# Patient Record
Sex: Male | Born: 1985 | Race: Black or African American | Hispanic: No | Marital: Single | Smoking: Current every day smoker
Health system: Southern US, Community
[De-identification: ages and names within clinical notes are randomized; demographics above are authoritative.]

---
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
## 2003-06-02 ENCOUNTER — Encounter: Admission: RE | Admit: 2003-06-02 | Discharge: 2003-06-02 | Payer: Self-pay | Admitting: Family Medicine

## 2004-04-14 ENCOUNTER — Emergency Department (HOSPITAL_COMMUNITY): Admission: EM | Admit: 2004-04-14 | Discharge: 2004-04-14 | Payer: Self-pay | Admitting: Family Medicine

## 2004-09-06 ENCOUNTER — Emergency Department (HOSPITAL_COMMUNITY): Admission: EM | Admit: 2004-09-06 | Discharge: 2004-09-06 | Payer: Self-pay | Admitting: Family Medicine

## 2004-10-30 ENCOUNTER — Emergency Department (HOSPITAL_COMMUNITY): Admission: EM | Admit: 2004-10-30 | Discharge: 2004-10-30 | Payer: Self-pay | Admitting: Family Medicine

## 2004-11-07 ENCOUNTER — Ambulatory Visit: Payer: Self-pay | Admitting: Family Medicine

## 2004-11-07 ENCOUNTER — Encounter: Admission: RE | Admit: 2004-11-07 | Discharge: 2004-11-07 | Payer: Self-pay | Admitting: Sports Medicine

## 2004-12-16 ENCOUNTER — Emergency Department (HOSPITAL_COMMUNITY): Admission: AD | Admit: 2004-12-16 | Discharge: 2004-12-16 | Payer: Self-pay | Admitting: Family Medicine

## 2006-03-06 DIAGNOSIS — F988 Other specified behavioral and emotional disorders with onset usually occurring in childhood and adolescence: Secondary | ICD-10-CM | POA: Insufficient documentation

## 2007-12-21 ENCOUNTER — Emergency Department (HOSPITAL_COMMUNITY): Admission: EM | Admit: 2007-12-21 | Discharge: 2007-12-21 | Payer: Self-pay | Admitting: Emergency Medicine

## 2010-04-11 ENCOUNTER — Emergency Department (HOSPITAL_COMMUNITY)
Admission: EM | Admit: 2010-04-11 | Discharge: 2010-04-11 | Disposition: A | Discharge status: Home / Self Care | Payer: Self-pay | Attending: Emergency Medicine | Admitting: Emergency Medicine

## 2010-04-11 ENCOUNTER — Emergency Department (HOSPITAL_COMMUNITY): Payer: Self-pay

## 2010-04-11 DIAGNOSIS — M79609 Pain in unspecified limb: Secondary | ICD-10-CM | POA: Insufficient documentation

## 2010-04-11 DIAGNOSIS — IMO0002 Reserved for concepts with insufficient information to code with codable children: Secondary | ICD-10-CM | POA: Insufficient documentation

## 2010-04-11 DIAGNOSIS — F17200 Nicotine dependence, unspecified, uncomplicated: Secondary | ICD-10-CM | POA: Insufficient documentation

## 2010-10-12 LAB — POCT URINALYSIS DIP (DEVICE)
Bilirubin Urine: NEGATIVE
Glucose, UA: NEGATIVE mg/dL
Hgb urine dipstick: NEGATIVE
Ketones, ur: NEGATIVE mg/dL
Nitrite: NEGATIVE
Protein, ur: NEGATIVE mg/dL
Specific Gravity, Urine: 1.015 (ref 1.005–1.030)
Urobilinogen, UA: 2 mg/dL — ABNORMAL HIGH (ref 0.0–1.0)
pH: 8 (ref 5.0–8.0)

## 2010-10-12 LAB — GC/CHLAMYDIA PROBE AMP, GENITAL
Chlamydia, DNA Probe: POSITIVE — AB
GC Probe Amp, Genital: NEGATIVE

## 2010-10-12 LAB — RPR: RPR Ser Ql: NONREACTIVE

## 2010-10-12 LAB — HIV ANTIBODY (ROUTINE TESTING W REFLEX): HIV: NONREACTIVE

## 2013-04-17 ENCOUNTER — Emergency Department: Payer: Self-pay | Admitting: Emergency Medicine

## 2013-05-11 ENCOUNTER — Emergency Department: Payer: Self-pay | Admitting: Internal Medicine

## 2013-06-26 ENCOUNTER — Emergency Department: Payer: Self-pay | Admitting: Emergency Medicine

## 2013-09-03 ENCOUNTER — Emergency Department: Payer: Self-pay | Admitting: Emergency Medicine

## 2014-11-03 ENCOUNTER — Encounter (HOSPITAL_COMMUNITY): Payer: Self-pay | Admitting: Emergency Medicine

## 2014-11-03 ENCOUNTER — Emergency Department (HOSPITAL_COMMUNITY)
Admission: EM | Admit: 2014-11-03 | Discharge: 2014-11-03 | Disposition: A | Discharge status: Home / Self Care | Payer: Self-pay | Attending: Emergency Medicine | Admitting: Emergency Medicine

## 2014-11-03 DIAGNOSIS — L03115 Cellulitis of right lower limb: Secondary | ICD-10-CM | POA: Insufficient documentation

## 2014-11-03 DIAGNOSIS — Z720 Tobacco use: Secondary | ICD-10-CM | POA: Insufficient documentation

## 2014-11-03 DIAGNOSIS — L02419 Cutaneous abscess of limb, unspecified: Secondary | ICD-10-CM

## 2014-11-03 DIAGNOSIS — L03119 Cellulitis of unspecified part of limb: Secondary | ICD-10-CM

## 2014-11-03 DIAGNOSIS — L02415 Cutaneous abscess of right lower limb: Secondary | ICD-10-CM | POA: Insufficient documentation

## 2014-11-03 MED ORDER — IBUPROFEN 600 MG PO TABS: 600.0000 mg | ORAL_TABLET | Freq: Four times a day (QID) | ORAL | Status: DC | PRN

## 2014-11-03 MED ORDER — LIDOCAINE-EPINEPHRINE 1 %-1:100000 IJ SOLN: 20.0000 mL | Freq: Once | INTRAMUSCULAR | Status: DC

## 2014-11-03 MED ORDER — CLINDAMYCIN HCL 300 MG PO CAPS: 300.0000 mg | ORAL_CAPSULE | Freq: Four times a day (QID) | ORAL | Status: DC

## 2014-11-03 MED ORDER — LIDOCAINE-EPINEPHRINE 1 %-1:100000 IJ SOLN
20.0000 mL | Freq: Once | INTRAMUSCULAR | Status: AC
  Administered 2014-11-03: 20 mL via INTRADERMAL
  Filled 2014-11-03: qty 1

## 2014-11-03 MED ORDER — HYDROCODONE-ACETAMINOPHEN 5-325 MG PO TABS: 2.0000 | ORAL_TABLET | ORAL | Status: DC | PRN

## 2014-11-03 MED ORDER — OXYCODONE-ACETAMINOPHEN 5-325 MG PO TABS: 1.0000 | ORAL_TABLET | ORAL | Status: DC | PRN

## 2014-11-03 MED ORDER — CLINDAMYCIN HCL 300 MG PO CAPS
300.0000 mg | ORAL_CAPSULE | Freq: Once | ORAL | Status: AC
  Administered 2014-11-03: 300 mg via ORAL
  Filled 2014-11-03: qty 1

## 2014-11-03 MED ORDER — OXYCODONE-ACETAMINOPHEN 5-325 MG PO TABS
1.0000 | ORAL_TABLET | Freq: Once | ORAL | Status: AC
  Administered 2014-11-03: 1 via ORAL
  Filled 2014-11-03: qty 1

## 2014-11-03 NOTE — ED Notes (Signed)
MD at bedside. 

## 2014-11-03 NOTE — ED Notes (Signed)
Pt. reports worsening skin abscess at right knee for several weeks with drainage .

## 2014-11-03 NOTE — ED Notes (Signed)
ID bedside

## 2014-11-03 NOTE — Discharge Instructions (Signed)

## 2014-11-03 NOTE — ED Provider Notes (Signed)
CSN: [id number]     Arrival date & time 11/03/14  0206 History  By signing my name below, I, Tanda Rockers, attest that this documentation has been prepared under the direction and in the presence of Loren Racer, MD. Electronically Signed: Tanda Rockers, ED Scribe. 11/03/2014. 3:06 AM.  Chief Complaint  Patient presents with  . Abscess   The history is provided by the patient. No language interpreter was used.     HPI Comments: Russell West is a 29 y.o. male who presents to the Emergency Department complaining of gradual onset, constant, two abscess to right knee for several weeks. Pt states he had an open wound on his knee that he was being treated for with Keflex prior to the abscesses forming. He notes drainage, swelling, and 8/10 pain to the area as well. Denies fever, chills, or any other associated symptoms.   History reviewed. No pertinent past medical history. History reviewed. No pertinent past surgical history. No family history on file. Social History  Substance Use Topics  . Smoking status: Current Every Day Smoker  . Smokeless tobacco: None  . Alcohol Use: Yes    Review of Systems  Constitutional: Negative for fever and chills.  Gastrointestinal: Negative for nausea and vomiting.  Musculoskeletal: Positive for joint swelling. Negative for arthralgias.  Skin: Positive for color change.       + Two abscesses to right knee with swelling and drainage  Neurological: Negative for dizziness, weakness and numbness.  All other systems reviewed and are negative.  Allergies  Review of patient's allergies indicates no known allergies.  Home Medications   Prior to Admission medications   Medication Sig Start Date End Date Taking? Authorizing Provider  clindamycin (CLEOCIN) 300 MG capsule Take 1 capsule (300 mg total) by mouth 4 (four) times daily. X 7 days 11/03/14   Loren Racer, MD  HYDROcodone-acetaminophen [facility]) 5-325 MG tablet Take 2 tablets by mouth every 4  (four) hours as needed. 11/03/14   Loren Racer, MD  ibuprofen (ADVIL,MOTRIN) 600 MG tablet Take 1 tablet (600 mg total) by mouth every 6 (six) hours as needed. 11/03/14   Loren Racer, MD   Triage VItals: BP 146/69 mmHg  Pulse 75  Temp(Src) 98.3 F (36.8 C) (Oral)  Resp 20  SpO2 100%   Physical Exam  Constitutional: He is oriented to person, place, and time. He appears well-developed and well-nourished. No distress.  HENT:  Head: Normocephalic and atraumatic.  Eyes: EOM are normal. Pupils are equal, round, and reactive to light.  Neck: Normal range of motion. Neck supple.  Cardiovascular: Normal rate and regular rhythm.   Pulmonary/Chest: Effort normal and breath sounds normal.  Abdominal: Soft.  Musculoskeletal: Normal range of motion. He exhibits no edema or tenderness.  Patient with normal range of motion of the right knee. There is some mild superficial swelling surrounding a 2 abscesses just proximal to the right knee. One is roughly 2 cm and a high diameter and the other 1.5 cm. There is some mild surrounding erythema.  Neurological: He is alert and oriented to person, place, and time.  Skin: Skin is warm and dry. No rash noted. No erythema.  Psychiatric: He has a normal mood and affect. His behavior is normal.  Nursing note and vitals reviewed.   ED Course  .Marland KitchenIncision and Drainage Date/Time: 11/03/2014 4:42 AM Performed by: Loren Racer Authorized by: Ranae Palms, Jelina Paulsen Consent: Verbal consent obtained. Patient identity confirmed: verbally with patient Type: abscess Body area: lower extremity Location details:  right leg Anesthesia: local infiltration Local anesthetic: lidocaine 1% with epinephrine Anesthetic total: 4 ml Scalpel size: 11 Incision type: single straight Incision depth: dermal Complexity: complex Drainage: purulent Drainage amount: moderate Wound treatment: wound left open Packing material: none Patient tolerance: Patient tolerated the  procedure well with no immediate complications   (including critical care time)  DIAGNOSTIC STUDIES: Oxygen Saturation is 100% on RA, normal by my interpretation.    COORDINATION OF CARE: 3:04 AM-Discussed treatment plan which includes pain medication and I&D with pt at bedside and pt agreed to plan.   Labs Review Labs Reviewed - No data to display  Imaging Review No results found.   EKG Interpretation None      MDM   Final diagnoses:  Cellulitis and abscess of leg    I personally performed the services described in this documentation, which was scribed in my presence. The recorded information has been reviewed and is accurate.    Given concern for possible overlying cellulitis will start on clindamycin. Patient's been given return precautions and is voiced understanding.    Loren Raceravid Lurdes Haltiwanger, MD 11/03/14 [phone]

## 2015-01-30 ENCOUNTER — Encounter: Payer: Self-pay | Admitting: Emergency Medicine

## 2015-01-30 ENCOUNTER — Emergency Department
Admission: EM | Admit: 2015-01-30 | Discharge: 2015-01-30 | Disposition: A | Discharge status: Home / Self Care | Payer: Self-pay | Attending: Emergency Medicine | Admitting: Emergency Medicine

## 2015-01-30 DIAGNOSIS — L03111 Cellulitis of right axilla: Secondary | ICD-10-CM

## 2015-01-30 DIAGNOSIS — L03112 Cellulitis of left axilla: Secondary | ICD-10-CM

## 2015-01-30 DIAGNOSIS — F17210 Nicotine dependence, cigarettes, uncomplicated: Secondary | ICD-10-CM | POA: Insufficient documentation

## 2015-01-30 MED ORDER — TRAMADOL HCL 50 MG PO TABS: 50.0000 mg | ORAL_TABLET | Freq: Four times a day (QID) | ORAL | Status: DC | PRN

## 2015-01-30 MED ORDER — SULFAMETHOXAZOLE-TRIMETHOPRIM 800-160 MG PO TABS: 1.0000 | ORAL_TABLET | Freq: Two times a day (BID) | ORAL | Status: DC

## 2015-01-30 NOTE — Discharge Instructions (Signed)

## 2015-01-30 NOTE — ED Provider Notes (Signed)
[facility] Emergency Department Provider Note  ____________________________________________  Time seen: Approximately 10:17 PM  I have reviewed the triage vital signs and the nursing notes.   HISTORY  Chief Complaint Abscess    HPI Russell West is a 30 y.o. male who presents emergency department for bumps and bilateral axilla. He states that he is switched to a new deodorant and since then he has noticed some increased swelling and pain to the "knots" in his armpit. He denies any fevers or chills. He denies any drainage. He states that area is very painful to the touch. She has not tried any medications prior to arrival. He has had other abscesses in the past with them the past 6 months.   History reviewed. No pertinent past medical history.  Patient Active Problem List   Diagnosis Date Noted  . 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 No pertinent past surgical history.  Current Outpatient Rx  Name  Route  Sig  Dispense  Refill  . clindamycin (CLEOCIN) 300 MG capsule   Oral   Take 1 capsule (300 mg total) by mouth 4 (four) times daily. X 7 days   28 capsule   0   . ibuprofen (ADVIL,MOTRIN) 600 MG tablet   Oral   Take 1 tablet (600 mg total) by mouth every 6 (six) hours as needed.   30 tablet   0   . oxyCODONE-acetaminophen (PERCOCET) 5-325 MG tablet   Oral   Take 1 tablet by mouth every 4 (four) hours as needed.   10 tablet   0   . sulfamethoxazole-trimethoprim (BACTRIM DS,SEPTRA DS) 800-160 MG tablet   Oral   Take 1 tablet by mouth 2 (two) times daily.   14 tablet   0   . traMADol (ULTRAM) 50 MG tablet   Oral   Take 1 tablet (50 mg total) by mouth every 6 (six) hours as needed.   20 tablet   0     Allergies Review of patient's allergies indicates no known allergies.  No family history on file.  Social History Social History  Substance Use Topics  . Smoking status: Current Every Day Smoker --  0.50 packs/day    Types: Cigarettes  . Smokeless tobacco: None  . Alcohol Use: Yes     Review of Systems  Constitutional: No fever/chills Eyes: No visual changes. No discharge ENT: No sore throat. Cardiovascular: no chest pain. Respiratory: no cough. No SOB. Gastrointestinal: No abdominal pain.  No nausea, no vomiting.  No diarrhea.  No constipation. Genitourinary: Negative for dysuria. No hematuria Musculoskeletal: Negative for back pain. Skin: Negative for rash. endorses swollen "knots" bilateral armpits  Neurological: Negative for headaches, focal weakness or numbness. 10-point ROS otherwise negative.  ____________________________________________   PHYSICAL EXAM:  VITAL SIGNS: ED Triage Vitals  Enc Vitals Group     BP 01/30/15 2028 135/62 mmHg     Pulse Rate 01/30/15 2028 80     Resp 01/30/15 2028 18     Temp 01/30/15 2028 98 F (36.7 C)     Temp src --      SpO2 01/30/15 2028 99 %     Weight 01/30/15 2028 200 lb (90.719 kg)     Height 01/30/15 2028  (1.778 m)     Head Cir --      Peak Flow --      Pain Score 01/30/15 2026 8     Pain Loc --  Pain Edu? --      Excl. in GC? --      Constitutional: Alert and oriented. Well appearing and in no acute distress. Eyes: Conjunctivae are normal. PERRL. EOMI. Head: Atraumatic. ENT:      Ears:       Nose: No congestion/rhinnorhea.      Mouth/Throat: Mucous membranes are moist.  Neck: No stridor.   Hematological/Lymphatic/Immunilogical: No cervical lymphadenopathy. Cardiovascular: Normal rate, regular rhythm. Normal S1 and S2.  Good peripheral circulation. Respiratory: Normal respiratory effort without tachypnea or retractions. Lungs CTAB. Gastrointestinal: Soft and nontender. No distention. No CVA tenderness. Musculoskeletal: No lower extremity tenderness nor edema.  No joint effusions. Neurologic:  Normal speech and language. No gross focal neurologic deficits are appreciated.  Skin:  Skin is warm, dry  and intact. No rash noted. 3 lesions noted to each axilla. Areas are edematous and erythematous. No drainage noted. No fluctuance. Area is very firm to palpation. Area is very tender to palpation.  Psychiatric: Mood and affect are normal. Speech and behavior are normal. Patient exhibits appropriate insight and judgement.   ____________________________________________   LABS (all labs ordered are listed, but only abnormal results are displayed)  Labs Reviewed - No data to display ____________________________________________  EKG   ____________________________________________  RADIOLOGY   No results found.  ____________________________________________    PROCEDURES  Procedure(s) performed:       Medications - No data to display   ____________________________________________   INITIAL IMPRESSION / ASSESSMENT AND PLAN / ED COURSE  Pertinent labs & imaging results that were available during my care of the patient were reviewed by me and considered in my medical decision making (see chart for details).  Patient's diagnosis is consistent with cellulitis to bilateral axilla. Patient will be discharged home with prescriptions for antibiotics. Patient is to follow up with primary care provider if symptoms persist past this treatment course. Patient is given ED precautions to return to the ED for any worsening or new symptoms.     ____________________________________________  FINAL CLINICAL IMPRESSION(S) / ED DIAGNOSES  Final diagnoses:  Cellulitis of axilla, left  Cellulitis of axilla, right      NEW MEDICATIONS STARTED DURING THIS VISIT:  New Prescriptions   SULFAMETHOXAZOLE-TRIMETHOPRIM (BACTRIM DS,SEPTRA DS) 800-160 MG TABLET    Take 1 tablet by mouth 2 (two) times daily.   TRAMADOL (ULTRAM) 50 MG TABLET    Take 1 tablet (50 mg total) by mouth every 6 (six) hours as needed.       Delorise Royals Ahmet Schank, PA-C 01/30/15 2220  Jeanmarie Plant, MD 01/30/15  2330

## 2015-01-30 NOTE — ED Notes (Signed)
Patient ambulatory to triage with steady gait, without difficulty or distress noted; pt reports abscess to axillae bilat since changing deodorant

## 2016-10-11 ENCOUNTER — Emergency Department (HOSPITAL_COMMUNITY): Payer: Self-pay

## 2016-10-11 ENCOUNTER — Emergency Department (HOSPITAL_COMMUNITY)
Admission: EM | Admit: 2016-10-11 | Discharge: 2016-10-11 | Disposition: A | Discharge status: Home / Self Care | Payer: Self-pay | Attending: Physician Assistant | Admitting: Physician Assistant

## 2016-10-11 ENCOUNTER — Encounter (HOSPITAL_COMMUNITY): Payer: Self-pay | Admitting: Emergency Medicine

## 2016-10-11 ENCOUNTER — Other Ambulatory Visit: Payer: Self-pay

## 2016-10-11 DIAGNOSIS — F17210 Nicotine dependence, cigarettes, uncomplicated: Secondary | ICD-10-CM | POA: Insufficient documentation

## 2016-10-11 DIAGNOSIS — R0789 Other chest pain: Secondary | ICD-10-CM | POA: Insufficient documentation

## 2016-10-11 DIAGNOSIS — F908 Attention-deficit hyperactivity disorder, other type: Secondary | ICD-10-CM | POA: Insufficient documentation

## 2016-10-11 NOTE — ED Triage Notes (Signed)
Pt c/o pain in right rib cage x's 2 weeks after hitting on the side of a couch.  Pt st's the rib pain causes his chest to hurt when he takes a deep breath or cough.  Pt also requesting a work note

## 2016-10-11 NOTE — Discharge Instructions (Signed)
Please read and follow all provided instructions.  Your diagnoses today include:  1. Chest wall pain     Tests performed today include: Vital signs. See below for your results today.   Medications prescribed:  Take as prescribed   Home care instructions:  Follow any educational materials contained in this packet.  Follow-up instructions: Please follow-up with your primary care provider for further evaluation of symptoms and treatment   Return instructions:  Please return to the Emergency Department if you do not get better, if you get worse, or new symptoms OR  - Fever (temperature greater than 101.21F)  - Bleeding that does not stop with holding pressure to the area    -Severe pain (please note that you may be more sore the day after your accident)  - Chest Pain  - Difficulty breathing  - Severe nausea or vomiting  - Inability to tolerate food and liquids  - Passing out  - Skin becoming red around your wounds  - Change in mental status (confusion or lethargy)  - New numbness or weakness    Please return if you have any other emergent concerns.  Additional Information:  Your vital signs today were: BP 139/72 (BP Location: Right Arm)    Pulse 62    Temp 98.1 F (36.7 C) (Oral)    Resp 16    Ht  (1.778 m)    Wt 93.4 kg (206 lb)    SpO2 99%    BMI 29.56 kg/m  If your blood pressure (BP) was elevated above 135/85 this visit, please have this repeated by your doctor within one month. ---------------

## 2016-10-11 NOTE — ED Provider Notes (Signed)
[facility]-EMERGENCY DEPT Provider Note   CSN: [id number] Arrival date & time: 10/11/16  1538     History   Chief Complaint Chief Complaint  Patient presents with  . Chest Pain    HPI Russell West is a 31 y.o. male.  HPI  31 y.o. male, presents to the Emergency Department today due to right rib cage pain x 2 weeks. This occurred after striking the side of a couch when tripping. Noted pain on onset. Notes rib pain causes pain with inspiration as well as coughing. Minimal pain without movement of chest wall. Pain with turning torso. Rates pain 3/10 currently with movement. Described as dull sensation. None at rest. No meds PTA. Has not tried OTC remedies other than Motrin once. No cough/congestion. No N/V. No diaphoresis. No numbness/tingling. No SOB/ABD pain. No other symptoms noted.    History reviewed. No pertinent past medical history.  Patient Active Problem List   Diagnosis Date Noted  . 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 No pertinent surgical history.     Home Medications    Prior to Admission medications   Medication Sig Start Date End Date Taking? Authorizing Provider  clindamycin (CLEOCIN) 300 MG capsule Take 1 capsule (300 mg total) by mouth 4 (four) times daily. X 7 days 11/03/14   Loren Racer, MD  ibuprofen (ADVIL,MOTRIN) 600 MG tablet Take 1 tablet (600 mg total) by mouth every 6 (six) hours as needed. 11/03/14   Loren Racer, MD  oxyCODONE-acetaminophen (PERCOCET) 5-325 MG tablet Take 1 tablet by mouth every 4 (four) hours as needed. 11/03/14   Loren Racer, MD  sulfamethoxazole-trimethoprim (BACTRIM DS,SEPTRA DS) 800-160 MG tablet Take 1 tablet by mouth 2 (two) times daily. 01/30/15   Cuthriell, Delorise Royals, PA-C  traMADol (ULTRAM) 50 MG tablet Take 1 tablet (50 mg total) by mouth every 6 (six) hours as needed. 01/30/15   Cuthriell, Delorise Royals, PA-C    Family History No family history on file.  Social  History Social History  Substance Use Topics  . Smoking status: Current Every Day Smoker    Packs/day: 0.50    Types: Cigarettes  . Smokeless tobacco: Never Used  . Alcohol use Yes     Allergies   Patient has no known allergies.   Review of Systems Review of Systems ROS reviewed and all are negative for acute change except as noted in the HPI.  Physical Exam Updated Vital Signs BP 139/72 (BP Location: Right Arm)   Pulse 62   Temp 98.1 F (36.7 C) (Oral)   Resp 16   Ht  (1.778 m)   Wt 93.4 kg (206 lb)   SpO2 99%   BMI 29.56 kg/m   Physical Exam  Constitutional: He is oriented to person, place, and time. He appears well-developed and well-nourished. No distress.  HENT:  Head: Normocephalic and atraumatic.  Right Ear: Tympanic membrane, external ear and ear canal normal.  Left Ear: Tympanic membrane, external ear and ear canal normal.  Nose: Nose normal.  Mouth/Throat: Uvula is midline, oropharynx is clear and moist and mucous membranes are normal. No trismus in the jaw. No oropharyngeal exudate, posterior oropharyngeal erythema or tonsillar abscesses.  Eyes: Pupils are equal, round, and reactive to light. EOM are normal.  Neck: Normal range of motion. Neck supple. No tracheal deviation present.  Cardiovascular: Normal rate, regular rhythm, S1 normal, S2 normal, normal heart sounds, intact distal pulses and normal pulses.   Pulmonary/Chest: Effort normal and breath sounds  normal. No respiratory distress. He has no decreased breath sounds. He has no wheezes. He has no rhonchi. He has no rales.  TTP right lateral chest wall. No palpable or visible deformities.   Abdominal: Normal appearance and bowel sounds are normal. There is no tenderness.  Musculoskeletal: Normal range of motion.  Neurological: He is alert and oriented to person, place, and time.  Skin: Skin is warm and dry.  Psychiatric: He has a normal mood and affect. His speech is normal and behavior is  normal. Thought content normal.     ED Treatments / Results  Labs (all labs ordered are listed, but only abnormal results are displayed) Labs Reviewed - No data to display  EKG  EKG Interpretation None       Radiology Dg Chest 2 View  Result Date: 10/11/2016 CLINICAL DATA:  Chest pain EXAM: CHEST  2 VIEW COMPARISON:  06/26/2013 chest radiograph. FINDINGS: Stable cardiomediastinal silhouette with normal heart size. No pneumothorax. No pleural effusion. Lungs appear clear, with no acute consolidative airspace disease and no pulmonary edema. No displaced fractures in the visualized chest. IMPRESSION: No active cardiopulmonary disease. Electronically Signed   By: Delbert Phenix M.D.   On: 10/11/2016 16:33    Procedures Procedures (including critical care time)  Medications Ordered in ED Medications - No data to display   Initial Impression / Assessment and Plan / ED Course  I have reviewed the triage vital signs and the nursing notes.  Pertinent labs & imaging results that were available during my care of the patient were reviewed by me and considered in my medical decision making (see chart for details).  Final Clinical Impressions(s) / ED Diagnoses   {I have reviewed and evaluated the relevant imaging studies.  {I have reviewed the relevant previous healthcare records.  {I obtained HPI from historian.   ED Course:  Assessment: presents to the Emergency Department today due to right rib cage pain x 2 weeks. This occurred after striking the side of a couch when tripping. Noted pain on onset. Notes rib pain causes pain with inspiration as well as coughing. Minimal pain without movement of chest wall. Pain with turning torso. Rates pain 3/10 currently with movement. Described as dull sensation. None at rest. No meds PTA. Has not tried OTC remedies other than Motrin once. No cough/congestion. No N/V. No diaphoresis. No numbness/tingling. No SOB/ABD pain. Patient X-Ray negative for  obvious fracture or dislocation.  Conservative therapy recommended and discussed. Doubt acute etiology (I.E. PE/ACS) given etiology of onset of pain. Likely musculoskeletal. Patient will be discharged home & is agreeable with above plan. Returns precautions discussed. Pt appears safe for discharge.  Disposition/Plan:  DC Home Additional Verbal discharge instructions given and discussed with patient.  Pt Instructed to f/u with PCP in the next week for evaluation and treatment of symptoms. Return precautions given Pt acknowledges and agrees with plan  Supervising Physician Mackuen, Courteney Lyn, *  Final diagnoses:  Chest wall pain    New Prescriptions New Prescriptions   No medications on file     Audry Pili, Cordelia Poche 10/11/16 1717    Corlis Leak, Cindee Salt, MD 10/12/16 0000

## 2020-07-01 ENCOUNTER — Emergency Department (HOSPITAL_COMMUNITY)
Admission: EM | Admit: 2020-07-01 | Discharge: 2020-07-01 | Disposition: A | Discharge status: Home / Self Care | Payer: Self-pay | Attending: Emergency Medicine | Admitting: Emergency Medicine

## 2020-07-01 ENCOUNTER — Encounter (HOSPITAL_COMMUNITY): Payer: Self-pay

## 2020-07-01 ENCOUNTER — Other Ambulatory Visit: Payer: Self-pay

## 2020-07-01 ENCOUNTER — Emergency Department (HOSPITAL_COMMUNITY): Payer: Self-pay

## 2020-07-01 DIAGNOSIS — M25562 Pain in left knee: Secondary | ICD-10-CM | POA: Insufficient documentation

## 2020-07-01 DIAGNOSIS — G8929 Other chronic pain: Secondary | ICD-10-CM | POA: Insufficient documentation

## 2020-07-01 DIAGNOSIS — F17210 Nicotine dependence, cigarettes, uncomplicated: Secondary | ICD-10-CM | POA: Insufficient documentation

## 2020-07-01 MED ORDER — MELOXICAM 7.5 MG PO TABS: 7.5000 mg | ORAL_TABLET | Freq: Every day | ORAL | 0 refills | Status: AC

## 2020-07-01 MED ORDER — LIDOCAINE 5 % EX PTCH: 1.0000 | MEDICATED_PATCH | CUTANEOUS | 0 refills | Status: AC

## 2020-07-01 NOTE — ED Provider Notes (Signed)
[facility] EMERGENCY DEPARTMENT Provider Note   CSN: [id number] Arrival date & time: 07/01/20  0301     History Chief Complaint  Patient presents with   Knee Pain    Russell West is a 35 y.o. male with no significant past medical history who presents for evaluation of knee pain.  Pain to left knee over the last 2 months.  He states he was sleeping, got up quickly and feels like he must of "twisted" his knee.  He has had pain to the medial aspect of his left knee since.  States he gets intermittent swelling.  Joint is not swollen today.  No overlying redness or warmth.  No tenderness to tib-fib, femur.  No unilateral leg swelling, redness, warmth.  No history of PE or DVT.  Pain only located at medial joint line.  Pain worse when he walks.  States he cannot do sudden or sharp movements as this causes sharp pain to his inner knee.  He is not followed by orthopedics.  Intermittently has taken Tylenol and ibuprofen at home with short-term relief of his pain.  He denies actual falls onto the knee.  Rates his pain a 6/10.  No hip or lower back pain. No radicular sx. No fever, chills, numbness, weakness of, redness, swelling, warmth. Ambulatory without difficulty. Denies additional aggravating or alleviating factors.  History obtained from patient and past medical records. No interpretor was used.  HPI     History reviewed. No pertinent past medical history.  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 No pertinent surgical history.     No family history on file.  Social History   Tobacco Use   Smoking status: Every Day    Packs/day: 0.50    Pack years: 0.00    Types: Cigarettes   Smokeless tobacco: Never  Substance Use Topics   Alcohol use: Yes   Drug use: No    Home Medications Prior to Admission medications   Medication Sig Start Date End Date Taking? Authorizing Provider   lidocaine (LIDODERM) 5 % Place 1 patch onto the skin daily. Remove & Discard patch within 12 hours or as directed by MD 07/01/20  Yes Rosmary Dionisio A, PA-C  meloxicam (MOBIC) 7.5 MG tablet Take 1 tablet (7.5 mg total) by mouth daily. 07/01/20  Yes Howell Groesbeck A, PA-C    Allergies    Patient has no known allergies.  Review of Systems   Review of Systems  Constitutional: Negative.   HENT: Negative.    Respiratory: Negative.    Cardiovascular: Negative.   Gastrointestinal: Negative.   Genitourinary: Negative.   Musculoskeletal:  Negative for gait problem.       Left knee pain  Neurological: Negative.   All other systems reviewed and are negative.  Physical Exam Updated Vital Signs BP 130/76 (BP Location: Right Arm)   Pulse 66   Temp 98.1 F (36.7 C) (Oral)   Resp 16   SpO2 95%   Physical Exam Vitals and nursing note reviewed.  Constitutional:      General: He is not in acute distress.    Appearance: He is well-developed. He is not ill-appearing, toxic-appearing or diaphoretic.  HENT:     Head: Normocephalic and atraumatic.     Nose: Nose normal.     Mouth/Throat:     Mouth: Mucous membranes are moist.  Eyes:     Pupils: Pupils are  equal, round, and reactive to light.  Cardiovascular:     Rate and Rhythm: Normal rate and regular rhythm.     Pulses: Normal pulses.     Heart sounds: Normal heart sounds.  Pulmonary:     Effort: Pulmonary effort is normal. No respiratory distress.     Breath sounds: Normal breath sounds.  Abdominal:     General: Bowel sounds are normal. There is no distension.     Palpations: Abdomen is soft.  Musculoskeletal:        General: Normal range of motion.     Cervical back: Normal range of motion and neck supple.     Comments: Tenderness to left medial joint line at knee.  No bony tenderness to tibial plateau, tib-fib, femur.  No obvious joint effusion.  Negative anterior drawer.  Nontender to popliteal fossa. Pain with valgus stress  to left knee.  Compartments soft.  Nontender calf.  Skin:    General: Skin is warm and dry.     Capillary Refill: Capillary refill takes less than 2 seconds.     Comments: No effusion.  No edema, erythema or warmth.  No rashes or lesions.  Neurological:     General: No focal deficit present.     Mental Status: He is alert and oriented to person, place, and time.     Motor: No weakness.     Gait: Gait normal.     Comments: Intact sensation.  Ambulatory without difficulty Intact strength    ED Results / Procedures / Treatments   Labs (all labs ordered are listed, but only abnormal results are displayed) Labs Reviewed - No data to display  EKG None  Radiology DG Knee Complete 4 Views Left  Result Date: 07/01/2020 CLINICAL DATA:  Left knee pain and swelling EXAM: LEFT KNEE - COMPLETE 4+ VIEW COMPARISON:  None. FINDINGS: No evidence of fracture, dislocation, or joint effusion. No evidence of arthropathy or other focal bone abnormality. Soft tissues are unremarkable. IMPRESSION: Negative. Electronically Signed   By: Helyn Numbers MD   On: 07/01/2020 03:34    Procedures .Ortho Injury Treatment  Date/Time: 07/01/2020 7:08 AM Performed by: Linwood Dibbles, PA-C Authorized by: Linwood Dibbles, PA-C   Consent:    Consent obtained:  Verbal   Consent given by:  Patient   Risks discussed:  Fracture, nerve damage, restricted joint movement, vascular damage, stiffness, recurrent dislocation and irreducible dislocation   Alternatives discussed:  No treatment, alternative treatment, immobilization, referral and delayed treatmentInjury location: knee Location details: left knee Injury type: soft tissue Pre-procedure neurovascular assessment: neurovascularly intact Pre-procedure distal perfusion: normal Pre-procedure neurological function: normal Pre-procedure range of motion: normal Immobilization: brace Splint Applied by: Milon Dikes Post-procedure neurovascular assessment:  post-procedure neurovascularly intact Post-procedure distal perfusion: normal Post-procedure neurological function: normal Post-procedure range of motion: normal     Medications Ordered in ED Medications - No data to display  ED Course  I have reviewed the triage vital signs and the nursing notes.  Pertinent labs & imaging results that were available during my care of the patient were reviewed by me and considered in my medical decision making (see chart for details).  Here for left knee pain x2 months.  Thinks he twisted it.  He is afebrile, nonseptic, not ill-appearing.  Tender to left medial joint line with some pain to valgus stress.  Negative anterior drawer.  No clinical evidence of DVT.  No overlying effusion, erythema, warmth.  Able to straight leg raise bilaterally.  Neurovascularly intact.  X-ray obtained here of left knee does not show any obvious effusion, fracture, dislocation.  Ambulatory here without difficulty  Placed in knee immobilizer, Rx for NSAIDs, Lidoderm patches and given FU with Ortho.  Low suspicion for occult fracture, dislocation, septic joint, hemarthrosis, VTE, ischemic limb, myositis.  The patient has been appropriately medically screened and/or stabilized in the ED. I have low suspicion for any other emergent medical condition which would require further screening, evaluation or treatment in the ED or require inpatient management.  Patient is hemodynamically stable and in no acute distress.  Patient able to ambulate in department prior to ED.  Evaluation does not show acute pathology that would require ongoing or additional emergent interventions while in the emergency department or further inpatient treatment.  I have discussed the diagnosis with the patient and answered all questions.  Pain is been managed while in the emergency department and patient has no further complaints prior to discharge.  Patient is comfortable with plan discussed in room and is  stable for discharge at this time.  I have discussed strict return precautions for returning to the emergency department.  Patient was encouraged to follow-up with PCP/specialist refer to at discharge.     MDM Rules/Calculators/A&P                           Final Clinical Impression(s) / ED Diagnoses Final diagnoses:  Chronic pain of left knee    Rx / DC Orders ED Discharge Orders          Ordered    meloxicam (MOBIC) 7.5 MG tablet  Daily        07/01/20 0654    lidocaine (LIDODERM) 5 %  Every 24 hours        07/01/20 0654             Taeden Geller A, PA-C 07/01/20 6045    Jacalyn Lefevre, MD 07/01/20 [phone]

## 2020-07-01 NOTE — Discharge Instructions (Signed)
Follow up with Orthopedics  Use the medications as prescribed  Return for new or worsening symptoms

## 2020-07-01 NOTE — ED Triage Notes (Signed)
Pt states that he has been having L knee pain for the past few weeks, denies injury.

## 2020-07-01 NOTE — Progress Notes (Signed)
Orthopedic Tech Progress Note Patient Details:  Russell West [birth year] [id number]  Ortho Devices Type of Ortho Device: Knee Sleeve Ortho Device/Splint Location: LLE Ortho Device/Splint Interventions: Application, Ordered   Post Interventions Patient Tolerated: Well Instructions Provided: Adjustment of device  Russell West A Aruna Nestler 07/01/2020, 7:13 AM

## 2020-10-01 ENCOUNTER — Ambulatory Visit (HOSPITAL_COMMUNITY)
Admission: EM | Admit: 2020-10-01 | Discharge: 2020-10-01 | Disposition: A | Discharge status: Home / Self Care | Payer: Self-pay

## 2020-10-01 ENCOUNTER — Emergency Department (HOSPITAL_COMMUNITY): Payer: Self-pay

## 2020-10-01 ENCOUNTER — Other Ambulatory Visit: Payer: Self-pay

## 2020-10-01 ENCOUNTER — Encounter (HOSPITAL_COMMUNITY): Payer: Self-pay | Admitting: Emergency Medicine

## 2020-10-01 ENCOUNTER — Encounter (HOSPITAL_COMMUNITY): Payer: Self-pay

## 2020-10-01 ENCOUNTER — Emergency Department (HOSPITAL_COMMUNITY)
Admission: EM | Admit: 2020-10-01 | Discharge: 2020-10-01 | Disposition: A | Discharge status: Home / Self Care | Payer: Self-pay | Attending: Emergency Medicine | Admitting: Emergency Medicine

## 2020-10-01 DIAGNOSIS — S3022XA Contusion of scrotum and testes, initial encounter: Secondary | ICD-10-CM | POA: Insufficient documentation

## 2020-10-01 DIAGNOSIS — Y9301 Activity, walking, marching and hiking: Secondary | ICD-10-CM | POA: Insufficient documentation

## 2020-10-01 DIAGNOSIS — W228XXA Striking against or struck by other objects, initial encounter: Secondary | ICD-10-CM | POA: Insufficient documentation

## 2020-10-01 DIAGNOSIS — F17210 Nicotine dependence, cigarettes, uncomplicated: Secondary | ICD-10-CM | POA: Insufficient documentation

## 2020-10-01 DIAGNOSIS — N50811 Right testicular pain: Secondary | ICD-10-CM

## 2020-10-01 DIAGNOSIS — N50819 Testicular pain, unspecified: Secondary | ICD-10-CM

## 2020-10-01 MED ORDER — OXYCODONE-ACETAMINOPHEN 5-325 MG PO TABS
1.0000 | ORAL_TABLET | Freq: Once | ORAL | Status: AC
  Administered 2020-10-01: 1 via ORAL
  Filled 2020-10-01: qty 1

## 2020-10-01 NOTE — ED Triage Notes (Signed)
Pt presents with groin pain. 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 Pt states he has been in pain since last night after chasing his dogs.

## 2020-10-01 NOTE — ED Notes (Signed)
Patient finding ride home.

## 2020-10-01 NOTE — ED Triage Notes (Signed)
Patient here from Urgent Care reporting right sided testicular pain that started last night.

## 2020-10-01 NOTE — Discharge Instructions (Signed)
You were seen in the emergency department for evaluation of testicular and scrotal pain after blunt injury.  You had an ultrasound that showed a hematoma but no evidence of torsion or testicular rupture.  Urology is recommending tight fitting underwear, ice, ibuprofen.  Follow-up with them in 1 week or so.  Return if any worsening or concerning symptoms

## 2020-10-01 NOTE — ED Provider Notes (Signed)
[facility]    CSN: [id number] Arrival date & time: 10/01/20  1547      History   Chief Complaint Chief Complaint  Patient presents with   Groin Pain    HPI Russell West is a 35 y.o. male presenting with groin pain for about 1 day.  States that he was chasing his dogs, and immediately after felt right testicular pain.  States he looked at it and it looks swollen.  Also with pain radiating to groin and back.  States he looked up symptoms of testicular torsion online and he is concerned that he has this.  Denies urinary symptoms or penile discharge.  Denies history of testicular torsion in the past.  HPI  History reviewed. No pertinent past medical history.  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 No pertinent surgical history.     Home Medications    Prior to Admission medications   Medication Sig Start Date End Date Taking? Authorizing Provider  lidocaine (LIDODERM) 5 % Place 1 patch onto the skin daily. Remove & Discard patch within 12 hours or as directed by MD 07/01/20   Henderly, Britni A, PA-C  meloxicam (MOBIC) 7.5 MG tablet Take 1 tablet (7.5 mg total) by mouth daily. 07/01/20   Henderly, Britni A, PA-C    Family History History reviewed. No pertinent family history.  Social History Social History   Tobacco Use   Smoking status: Every Day    Packs/day: 0.50    Types: Cigarettes   Smokeless tobacco: Never  Vaping Use   Vaping Use: Never used  Substance Use Topics   Alcohol use: Yes   Drug use: No     Allergies   Patient has no known allergies.   Review of Systems Review of Systems  Constitutional:  Negative for chills and fever.  HENT:  Negative for sore throat.   Eyes:  Negative for pain and redness.  Respiratory:  Negative for shortness of breath.   Cardiovascular:  Negative for chest pain.  Gastrointestinal:  Negative for abdominal pain,  diarrhea, nausea and vomiting.  Genitourinary:  Positive for testicular pain. Negative for decreased urine volume, difficulty urinating, dysuria, flank pain, frequency, genital sores, hematuria, penile discharge, penile pain, penile swelling, scrotal swelling and urgency.  Musculoskeletal:  Negative for back pain.  Skin:  Negative for rash.  All other systems reviewed and are negative.   Physical Exam Triage Vital Signs ED Triage Vitals  Enc Vitals Group     BP 10/01/20 1646 126/77     Pulse Rate 10/01/20 1645 86     Resp 10/01/20 1646 19     Temp 10/01/20 1646 98.1 F (36.7 C)     Temp Source 10/01/20 1646 Oral     SpO2 10/01/20 1645 97 %     Weight --      Height --      Head Circumference --      Peak Flow --      Pain Score 10/01/20 1643 6     Pain Loc --      Pain Edu? --      Excl. in GC? --    No data found.  Updated Vital Signs BP 126/77 (BP Location: Left Arm)   Pulse 86   Temp 98.1 F (36.7 C) (Oral)   Resp 19   SpO2 97%   Visual Acuity Right Eye Distance:   Left  Eye Distance:   Bilateral Distance:    Right Eye Near:   Left Eye Near:    Bilateral Near:     Physical Exam Vitals reviewed.  Constitutional:      General: He is not in acute distress.    Appearance: Normal appearance. He is not ill-appearing.  HENT:     Head: Normocephalic and atraumatic.     Mouth/Throat:     Mouth: Mucous membranes are moist.     Comments: Moist mucous membranes Eyes:     Extraocular Movements: Extraocular movements intact.     Pupils: Pupils are equal, round, and reactive to light.  Cardiovascular:     Rate and Rhythm: Normal rate and regular rhythm.     Heart sounds: Normal heart sounds.  Pulmonary:     Effort: Pulmonary effort is normal.     Breath sounds: Normal breath sounds. No wheezing, rhonchi or rales.  Abdominal:     General: Bowel sounds are normal. There is no distension.     Palpations: Abdomen is soft. There is no mass.     Tenderness: There is  no abdominal tenderness. There is no right CVA tenderness, left CVA tenderness, guarding or rebound.  Genitourinary:    Comments: Declines GU exam but did show me a photo of his right testicle which does appear enlarged. Patient ambulating with pain. Skin:    General: Skin is warm.     Capillary Refill: Capillary refill takes less than 2 seconds.     Comments: Good skin turgor  Neurological:     General: No focal deficit present.     Mental Status: He is alert and oriented to person, place, and time.  Psychiatric:        Mood and Affect: Mood normal.        Behavior: Behavior normal.     UC Treatments / Results  Labs (all labs ordered are listed, but only abnormal results are displayed) Labs Reviewed - No data to display  EKG   Radiology No results found.  Procedures Procedures (including critical care time)  Medications Ordered in UC Medications - No data to display  Initial Impression / Assessment and Plan / UC Course  I have reviewed the triage vital signs and the nursing notes.  Pertinent labs & imaging results that were available during my care of the patient were reviewed by me and considered in my medical decision making (see chart for details).     This patient is a very pleasant 35 y.o. year old male presenting with testicular pain and swelling following running.  This patient declined GU exam but did show me a photo which does indeed show an enlarged testicle.  Given concern, I recommended he head to the emergency department for ultrasound and further evaluation.  He is in agreement, states he will head straight there.   Final Clinical Impressions(s) / UC Diagnoses   Final diagnoses:  Pain in testicle, unspecified laterality     Discharge Instructions      -Please head to ED for further evaluation of possible testicular torsion. If you do have a testicular torsion this is a medical emergency and needs to be evaluated with ultrasound and possible surgery  immediately so you don't lose the testicle.   ED Prescriptions   None    PDMP not reviewed this encounter.   Rhys Martini, PA-C 10/01/20 1815

## 2020-10-01 NOTE — ED Provider Notes (Signed)
[facility]-EMERGENCY DEPT Provider Note   CSN: [id number] Arrival date & time: 10/01/20  1755     History Chief Complaint  Patient presents with   Testicle Pain    Russell West is a 35 y.o. male.  He is here with a complaint of right scrotal pain and swelling that started this morning.  He said he was walking the dogs and they saw a cat which pulled him forward and he struck his testicles on a pole.  Since then he has had symptoms.  He went to urgent care and they referred him here for an ultrasound.  He has had no difficulty urinating and he has not seen any blood in his urine.  No other injuries or complaints.  Right testicle hurts more than left and is swollen.  The history is provided by the patient.  Testicle Pain This is a new problem. The current episode started 6 to 12 hours ago. The problem occurs constantly. The problem has not changed since onset.Pertinent negatives include no chest pain, no abdominal pain, no headaches and no shortness of breath. The symptoms are aggravated by walking. Nothing relieves the symptoms. He has tried rest for the symptoms. The treatment provided no relief.      History reviewed. No pertinent past medical history.  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 No pertinent surgical history.     No family history on file.  Social History   Tobacco Use   Smoking status: Every Day    Packs/day: 0.50    Types: Cigarettes   Smokeless tobacco: Never  Vaping Use   Vaping Use: Never used  Substance Use Topics   Alcohol use: Yes   Drug use: No    Home Medications Prior to Admission medications   Medication Sig Start Date End Date Taking? Authorizing Provider  lidocaine (LIDODERM) 5 % Place 1 patch onto the skin daily. Remove & Discard patch within 12 hours or as directed by MD 07/01/20   Henderly, Britni A, PA-C  meloxicam (MOBIC) 7.5 MG  tablet Take 1 tablet (7.5 mg total) by mouth daily. 07/01/20   Henderly, Britni A, PA-C    Allergies    Patient has no known allergies.  Review of Systems   Review of Systems  Constitutional:  Negative for fever.  HENT:  Negative for sore throat.   Eyes:  Negative for visual disturbance.  Respiratory:  Negative for shortness of breath.   Cardiovascular:  Negative for chest pain.  Gastrointestinal:  Negative for abdominal pain.  Genitourinary:  Positive for scrotal swelling and testicular pain. Negative for dysuria.  Musculoskeletal:  Negative for back pain.  Skin:  Negative for wound.  Neurological:  Negative for headaches.   Physical Exam Updated Vital Signs BP 129/73 (BP Location: Left Arm)   Pulse 90   Temp 98.2 F (36.8 C) (Oral)   Resp 16   SpO2 98%   Physical Exam Vitals and nursing note reviewed.  Constitutional:      Appearance: Normal appearance. He is well-developed.  HENT:     Head: Normocephalic and atraumatic.  Eyes:     Conjunctiva/sclera: Conjunctivae normal.  Cardiovascular:     Rate and Rhythm: Normal rate and regular rhythm.     Pulses: Normal pulses.  Pulmonary:     Effort: Pulmonary effort is normal.  Abdominal:     Tenderness: There is no abdominal tenderness. There  is no guarding or rebound.  Genitourinary:    Penis: Normal.      Testes:        Right: Tenderness and swelling present.        Left: Tenderness or swelling not present.  Musculoskeletal:     Cervical back: Neck supple.  Skin:    General: Skin is warm and dry.  Neurological:     General: No focal deficit present.     Mental Status: He is alert.     GCS: GCS eye subscore is 4. GCS verbal subscore is 5. GCS motor subscore is 6.     Gait: Gait normal.    ED Results / Procedures / Treatments   Labs (all labs ordered are listed, but only abnormal results are displayed) Labs Reviewed - No data to display  EKG None  Radiology US Scrotum  Result Date: 10/01/2020 CLINICAL  DATA:  Injury swelling EXAM: ULTRASOUND OF SCROTUM TECHNIQUE: Complete ultrasound examination of the testicles, epididymis, and other scrotal structures was performed. COMPARISON:  None. FINDINGS: Right testicle Measurements: 4.2 x 2.6 x 3 cm. No mass or microlithiasis visualized. Thickened soft tissue posterior and inferior aspect of the right testis with flow. Heterogenous echotexture. Left testicle Measurements: 4.6 x 2.3 x 2.7 cm. No mass or microlithiasis visualized. Right epididymis:  Normal in size and appearance. Left epididymis:  Normal in size and appearance. Hydrocele:  None visualized. Varicocele:  None visualized. IMPRESSION: 1. Heterogeneous hypoechoic vascular tissue surrounding inferior pole of the testis and slightly posterior to it. This is asymmetric compared to the contralateral left testis. Suspect that the findings are related to soft tissue contusion and edema involving the scrotum and possibly the tail of the epididymis. Short interval sonographic follow-up recommended to ensure resolution. 2. Otherwise negative scrotal ultrasound Electronically Signed   By: Jasmine Pang M.D.   On: 10/01/2020 19:46    Procedures Procedures   Medications Ordered in ED Medications  oxyCODONE-acetaminophen (PERCOCET/ROXICET) 5-325 MG per tablet 1 tablet (has no administration in time range)    ED Course  I have reviewed the triage vital signs and the nursing notes.  Pertinent labs & imaging results that were available during my care of the patient were reviewed by me and considered in my medical decision making (see chart for details).  Clinical Course as of 10/02/20 1048  Sun Oct 01, 2020  2013 Reviewed results with patient regarding urology's recommendations.  He is comfortable plan for discharge. [MB]  2014 Discussed with Dr. Retta Diones from [facility] urology.  He reviewed the ultrasound and said the patient is appropriate for outpatient follow-up.  Tight briefs, ice, ibuprofen, follow-up in  a week or so in the office. [MB]    Clinical Course User Index [MB] Terrilee Files, MD   MDM Rules/Calculators/A&P                           This patient complains of right testicular pain and swelling after direct trauma; this involves an extensive number of treatment Options and is a complaint that carries with it a high risk of complications and Morbidity. The differential includes testicular rupture, hematoma, torsion  I ordered medication oral pain medication with improvement of symptoms  I ordered imaging studies which included scrotal ultrasound and I independently    visualized and interpreted imaging which showed hematoma, no evidence of torsion or testicular rupture Previous records obtained and reviewed in epic including urgent care visit I  consulted Dr. Retta Diones urology and discussed lab and imaging findings  Critical Interventions: None  After the interventions stated above, I reevaluated the patient and found patient symptoms to be improved.  Reviewed results of ultrasound and urology recommendations.  All questions answered.  Return instructions discussed  Final Clinical Impression(s) / ED Diagnoses Final diagnoses:  Right testicular pain  Traumatic hematoma of scrotum, initial encounter    Rx / DC Orders ED Discharge Orders     None        Terrilee Files, MD 10/02/20 1051

## 2020-10-01 NOTE — Discharge Instructions (Addendum)
-  Please head to ED for further evaluation of possible testicular torsion. If you do have a testicular torsion this is a medical emergency and needs to be evaluated with ultrasound and possible surgery immediately so you don't lose the testicle.

## 2020-10-01 NOTE — ED Provider Notes (Signed)
Emergency Medicine Provider Triage Evaluation Note  Russell West , a 35 y.o. male  was evaluated in triage.  Pt complains of testicular pain and swelling.  Patient states he was walking his dogs at about 6 AM this morning when they saw a cat and pulled him into a pole where he hit his testicle.  Since then he has had worsening right testicular pain and swelling, it is bothering him with all movement.  He presented to urgent care and they sent him here for evaluation.  Review of Systems  Positive: Testicular pain and swelling, right lower back pain Negative: Dysuria, diarrhea, abdominal pain, N/V  Physical Exam  BP 129/73 (BP Location: Left Arm)   Pulse 90   Temp 98.2 F (36.8 C) (Oral)   Resp 16   SpO2 98%  Gen:   Awake, no distress   Resp:  Normal effort  MSK:   Moves extremities without difficulty  Other:    Medical Decision Making  Medically screening exam initiated at 6:30 PM.  Appropriate orders placed.  Willmer Raatz was informed that the remainder of the evaluation will be completed by another provider, this initial triage assessment does not replace that evaluation, and the importance of remaining in the ED until their evaluation is complete.  MSE was initiated and I personally evaluated the patient and placed orders (if any) at  6:30 PM on October 01, 2020.  The patient appears stable so that the remainder of the MSE may be completed by another provider.    Jeanella Flattery 10/01/20 1830    Koleen Distance, MD 10/01/20 2218

## 2021-01-04 ENCOUNTER — Other Ambulatory Visit: Payer: Self-pay

## 2021-01-04 ENCOUNTER — Encounter (HOSPITAL_COMMUNITY): Payer: Self-pay | Admitting: Emergency Medicine

## 2021-01-04 ENCOUNTER — Ambulatory Visit (HOSPITAL_COMMUNITY)
Admission: EM | Admit: 2021-01-04 | Discharge: 2021-01-04 | Disposition: A | Discharge status: Home / Self Care | Payer: Self-pay | Attending: Emergency Medicine | Admitting: Emergency Medicine

## 2021-01-04 DIAGNOSIS — S46912A Strain of unspecified muscle, fascia and tendon at shoulder and upper arm level, left arm, initial encounter: Secondary | ICD-10-CM | POA: Insufficient documentation

## 2021-01-04 DIAGNOSIS — R369 Urethral discharge, unspecified: Secondary | ICD-10-CM | POA: Insufficient documentation

## 2021-01-04 DIAGNOSIS — Z7251 High risk heterosexual behavior: Secondary | ICD-10-CM | POA: Insufficient documentation

## 2021-01-04 MED ORDER — DOXYCYCLINE HYCLATE 100 MG PO CAPS: 100.0000 mg | ORAL_CAPSULE | Freq: Two times a day (BID) | ORAL | 0 refills | Status: AC

## 2021-01-04 NOTE — ED Triage Notes (Signed)
Pt is present today with upper back and penile discharge. Pt states sx started x3 days ago.

## 2021-01-04 NOTE — Discharge Instructions (Addendum)
Please begin chlamydia treatment with doxycycline, your prescription has been sent to your pharmacy, please take 1 tablet twice daily for the next 7 days.  Please try not to skip doses.  The results of your STD testing today will be made available to you once received.  They will initially be posted to your MyChart and, if any of your results are abnormal, you will receive a phone call with those results along with further instructions regarding treatment.

## 2021-01-04 NOTE — ED Provider Notes (Signed)
[facility]    CSN: [id number] Arrival date & time: 01/04/21  1651    HISTORY   Chief Complaint  Patient presents with   Abdominal Pain   Penile Discharge   HPI Russell West is a 35 y.o. male. Patient complains of penile discharge that started yesterday.  States he had sexual intercourse with a new partner 5 days ago, states he did not wear a condom.  Patient states that the penile discharge is not foul-smelling, not purulent in appearance, thin milky appearance without blood.  Patient denies burning with urination, scrotal pain, scrotal swelling, inguinal pain, testicular pain.  Patient denies abdominal pain.  Patient also complains of some mild intermittent muscle pain in his left upper shoulder, states pain approximately 2 out of 10 at this time, states that it feels like it is getting better at this time.  Patient states he does a lot of heavy lifting with his job, thinks this may be the cause.  The history is provided by the patient.  History reviewed. No pertinent past medical history. 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 No pertinent surgical history.  Home Medications    Prior to Admission medications   Medication Sig Start Date End Date Taking? Authorizing Provider  lidocaine (LIDODERM) 5 % Place 1 patch onto the skin daily. Remove & Discard patch within 12 hours or as directed by MD 07/01/20   Henderly, Britni A, PA-C  meloxicam (MOBIC) 7.5 MG tablet Take 1 tablet (7.5 mg total) by mouth daily. 07/01/20   Henderly, Britni A, PA-C   Family History History reviewed. No pertinent family history. Social History Social History   Tobacco Use   Smoking status: Every Day    Packs/day: 0.50    Types: Cigarettes   Smokeless tobacco: Never  Vaping Use   Vaping Use: Never used  Substance Use Topics   Alcohol use: Yes   Drug use: No   Allergies   Patient has no known  allergies.  Review of Systems Review of Systems Pertinent findings noted in history of present illness.   Physical Exam Triage Vital Signs ED Triage Vitals  Enc Vitals Group     BP 11/03/20 0827 (!) 147/82     Pulse Rate 11/03/20 0827 72     Resp 11/03/20 0827 18     Temp 11/03/20 0827 98.3 F (36.8 C)     Temp Source 11/03/20 0827 Oral     SpO2 11/03/20 0827 98 %     Weight --      Height --      Head Circumference --      Peak Flow --      Pain Score 11/03/20 0826 5     Pain Loc --      Pain Edu? --      Excl. in GC? --   No data found.  Updated Vital Signs BP 140/71 (BP Location: Right Arm)    Pulse 84    Temp 98.1 F (36.7 C) (Oral)    Resp 18    SpO2 98%   Physical Exam Vitals and nursing note reviewed.  Constitutional:      General: He is not in acute distress.    Appearance: Normal appearance. He is not ill-appearing.  HENT:     Head: Normocephalic and atraumatic.  Eyes:     General: Lids are normal.  Right eye: No discharge.        Left eye: No discharge.     Extraocular Movements: Extraocular movements intact.     Conjunctiva/sclera: Conjunctivae normal.     Right eye: Right conjunctiva is not injected.     Left eye: Left conjunctiva is not injected.  Neck:     Trachea: Trachea and phonation normal.  Cardiovascular:     Rate and Rhythm: Normal rate and regular rhythm.     Pulses: Normal pulses.     Heart sounds: Normal heart sounds. No murmur heard.   No friction rub. No gallop.  Pulmonary:     Effort: Pulmonary effort is normal. No accessory muscle usage, prolonged expiration or respiratory distress.     Breath sounds: Normal breath sounds. No stridor, decreased air movement or transmitted upper airway sounds. No decreased breath sounds, wheezing, rhonchi or rales.  Chest:     Chest wall: No tenderness.  Genitourinary:    Comments: Pt politely declines GU exam, pt did provide a swab for testing.   Musculoskeletal:        General: Normal  range of motion.     Left shoulder: Tenderness (Subscapularis) present.     Cervical back: Normal range of motion and neck supple. Normal range of motion.  Lymphadenopathy:     Cervical: No cervical adenopathy.  Skin:    General: Skin is warm and dry.     Findings: No erythema or rash.  Neurological:     General: No focal deficit present.     Mental Status: He is alert and oriented to person, place, and time.  Psychiatric:        Mood and Affect: Mood normal.        Behavior: Behavior normal.    Visual Acuity Right Eye Distance:   Left Eye Distance:   Bilateral Distance:    Right Eye Near:   Left Eye Near:    Bilateral Near:     UC Couse / Diagnostics / Procedures:    EKG  Radiology No results found.  Procedures Procedures (including critical care time)  UC Diagnoses / Final Clinical Impressions(s)   I have reviewed the triage vital signs and the nursing notes.  Pertinent labs & imaging results that were available during my care of the patient were reviewed by me and considered in my medical decision making (see chart for details).   Final diagnoses:  Penile discharge  Muscle strain, shoulder region, left, initial encounter  Unprotected sex   Patient was agreeable to empiric treatment for presumed chlamydia given symptoms, doxycycline sent to pharmacy.  Penile swab was provided by patient, we will notify him of results once received and treat as necessary.  Patient advised to allow muscles in his left shoulder to rest, can continue ibuprofen, ice, topical anti-inflammatories as needed.  ED Prescriptions     Medication Sig Dispense Auth. Provider   doxycycline (VIBRAMYCIN) 100 MG capsule Take 1 capsule (100 mg total) by mouth 2 (two) times daily for 7 days. 14 capsule Theadora Rama Scales, PA-C      PDMP not reviewed this encounter.  Pending results:  Labs Reviewed  CYTOLOGY, (ORAL, ANAL, URETHRAL) ANCILLARY ONLY    Medications Ordered in UC: Medications  - No data to display  Disposition Upon Discharge:  Condition: stable for discharge home  Patient presents today with concerns for exposure to sexually transmitted disease, requesting testing.  STD screening was performed as indicated.  Patient has been advised that the  results of screening will be made available to them via MyChart and, if there are any positive findings, they will be contacted by phone, recommendations for treatment will be advised and prescriptions will be provided as indicated based on clinical guidelines.  Patient has also been advised that if treatment is recommended, they should abstain from sexual intercourse of all forms until treatment is complete.  Patient has further been advised that once treatment is complete, they have not had a complete resolution of their symptoms, if any, they should continue to abstain from sexual intercourse with all forms and follow-up with her primary care provider or return to urgent care for repeat testing.  As such, the patient has been evaluated and assessed, work-up was performed and treatment was provided in alignment with urgent care protocols and evidence based medicine.  Patient/parent/caregiver has been advised that the patient may require follow up for further testing and/or treatment if the symptoms continue in spite of treatment, as clinically indicated and appropriate.  Routine symptom specific, illness specific and/or disease specific instructions were discussed with the patient and/or caregiver at length.  Prevention strategies for avoiding STD exposure were also discussed.  The patient will follow up with their current PCP if and as advised. If the patient does not currently have a PCP we will assist them in obtaining one.   The patient may need specialty follow up if the symptoms continue, in spite of conservative treatment and management, for further workup, evaluation, consultation and treatment as clinically indicated and  appropriate.  Patient/parent/caregiver verbalized understanding and agreement of plan as discussed.  All questions were addressed during visit.  Please see discharge instructions below for further details of plan.  Discharge Instructions:   Discharge Instructions      Please begin chlamydia treatment with doxycycline, your prescription has been sent to your pharmacy, please take 1 tablet twice daily for the next 7 days.  Please try not to skip doses.  The results of your STD testing today will be made available to you once received.  They will initially be posted to your MyChart and, if any of your results are abnormal, you will receive a phone call with those results along with further instructions regarding treatment.         This office note has been dictated using Teaching laboratory technician.  Unfortunately, and despite my best efforts, this method of dictation can sometimes lead to occasional typographical or grammatical errors.  I apologize in advance if this occurs.      Theadora Rama Scales, [place] 01/04/21 1831

## 2021-01-05 ENCOUNTER — Telehealth (HOSPITAL_COMMUNITY): Payer: Self-pay | Admitting: Emergency Medicine

## 2021-01-05 LAB — CYTOLOGY, (ORAL, ANAL, URETHRAL) ANCILLARY ONLY
Chlamydia: POSITIVE — AB
Comment: NEGATIVE
Comment: NEGATIVE
Comment: NORMAL
Neisseria Gonorrhea: NEGATIVE
Trichomonas: POSITIVE — AB

## 2021-01-05 MED ORDER — METRONIDAZOLE 500 MG PO TABS: 2000.0000 mg | ORAL_TABLET | Freq: Once | ORAL | 0 refills | Status: AC

## 2022-06-24 IMAGING — US US SCROTUM
1 series · 15 of 25 positions shown · non-contrast
Comparison: None.

CLINICAL DATA: Injury swelling

EXAM:
ULTRASOUND OF SCROTUM
TECHNIQUE: Complete ultrasound examination of the testicles, epididymis, and
other scrotal structures was performed.

[Series 1: us scrotum mc & wl · 15 of 67 slices shown]
[im 1/67]
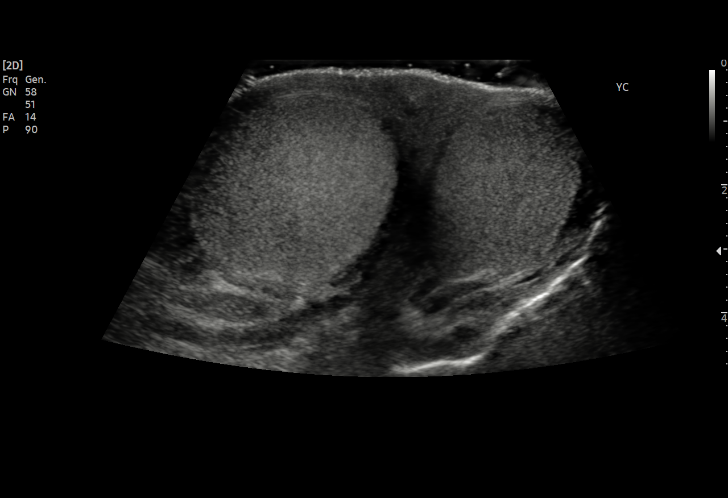
[im 6/67]
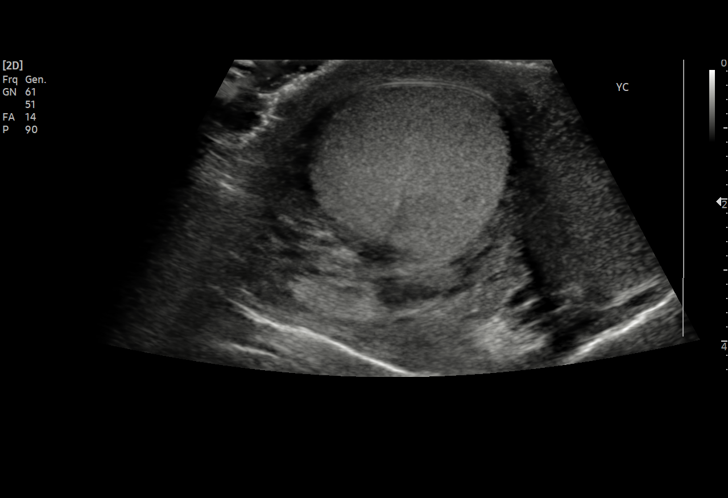
[im 12/67]
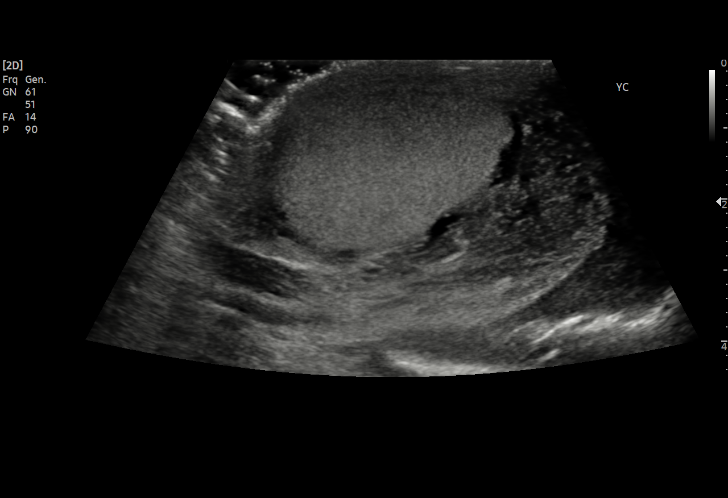
[im 14/67]
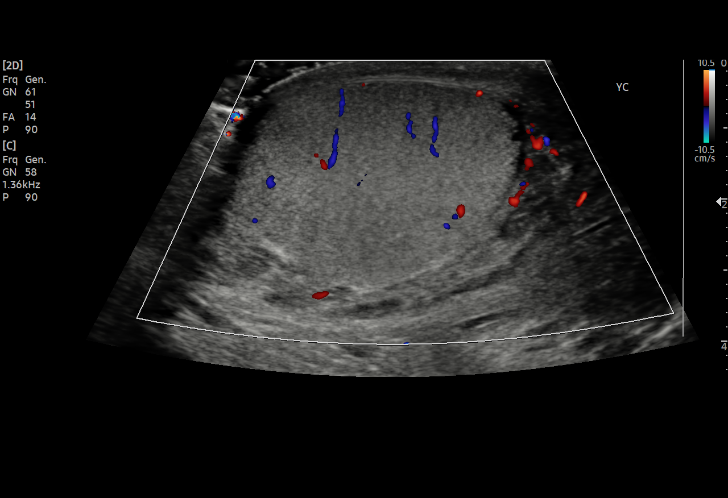
[im 20/67]
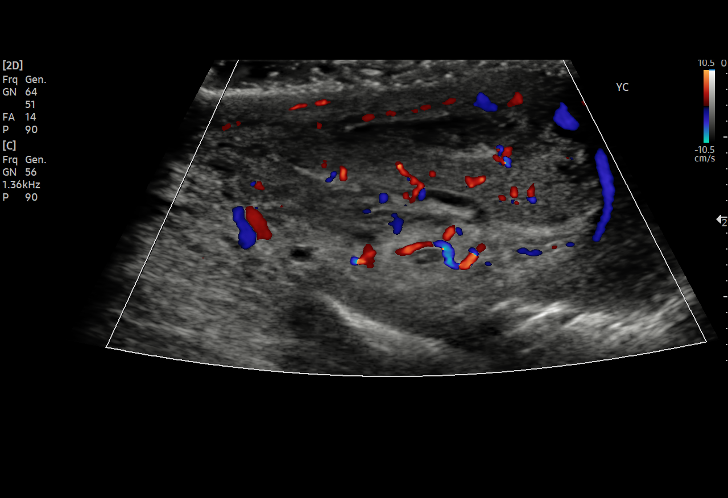
[im 25/67]
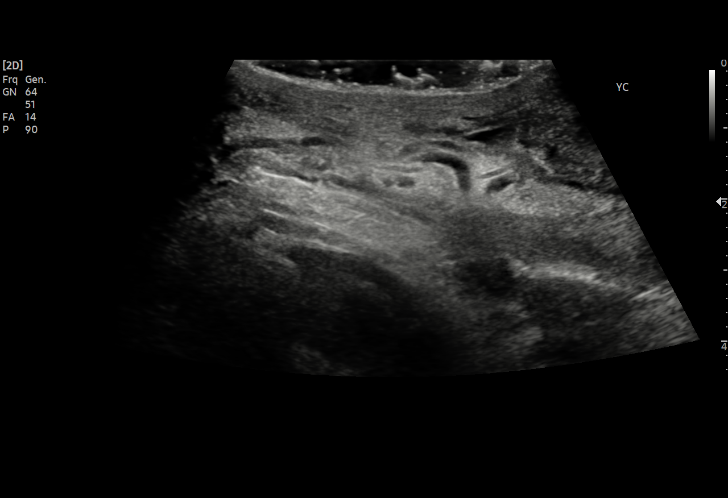
[im 28/67]
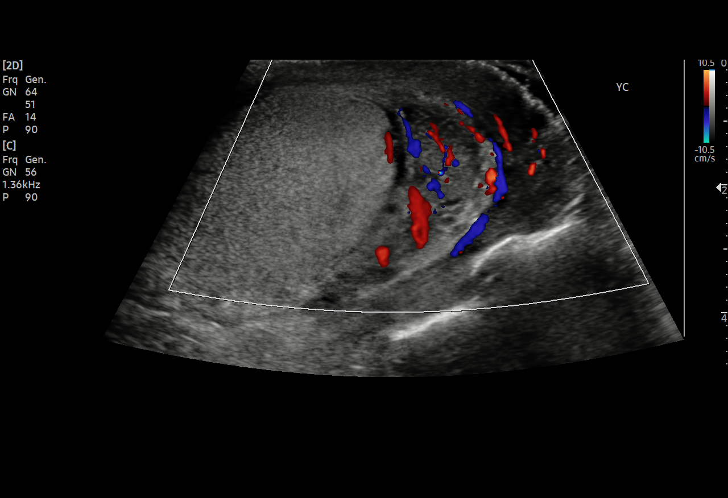
[im 34/67]
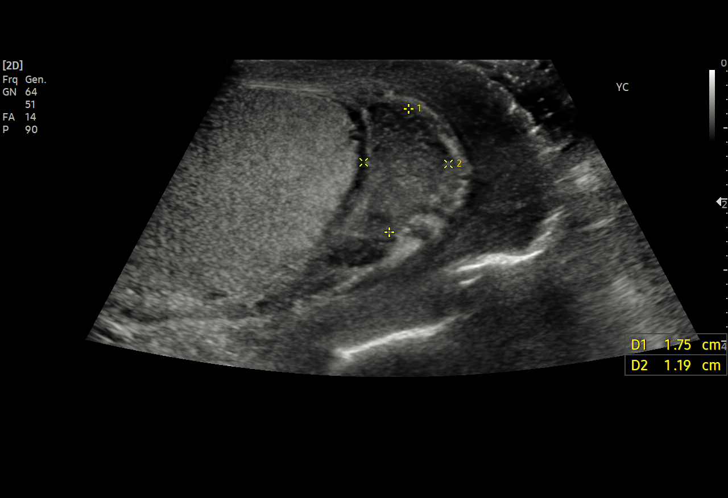
[im 39/67]
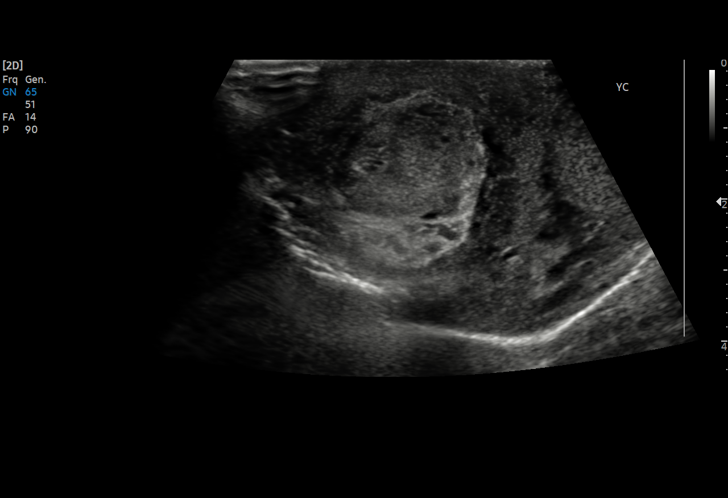
[im 42/67]
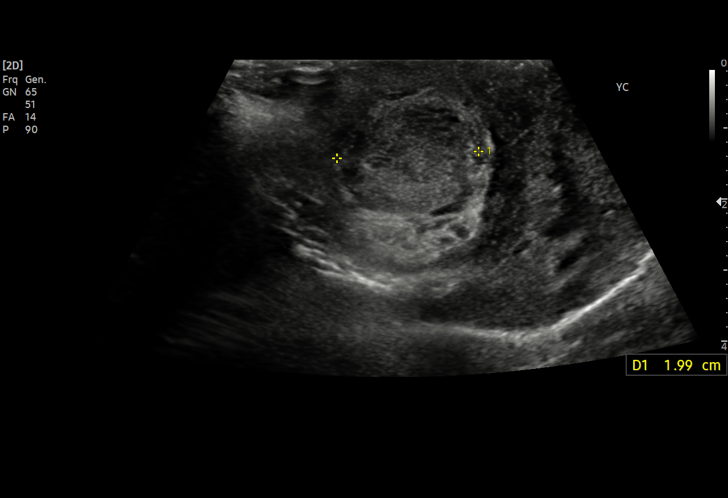
[im 47/67]
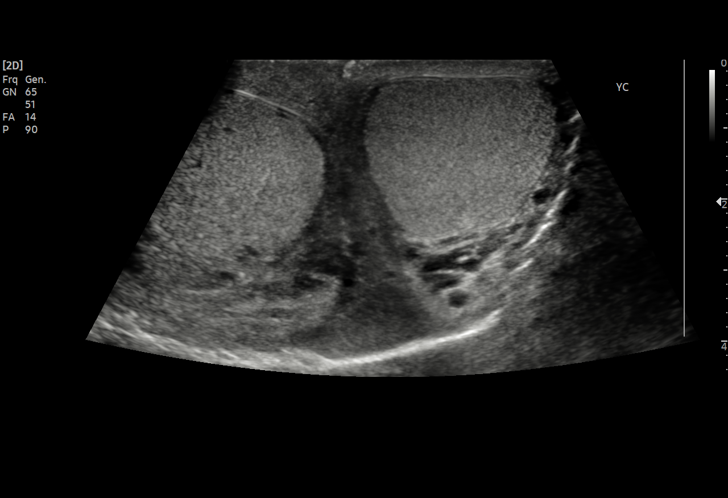
[im 53/67]
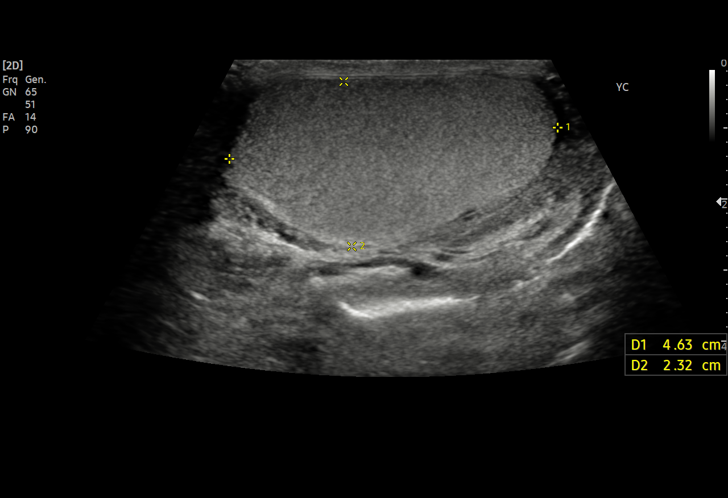
[im 56/67]
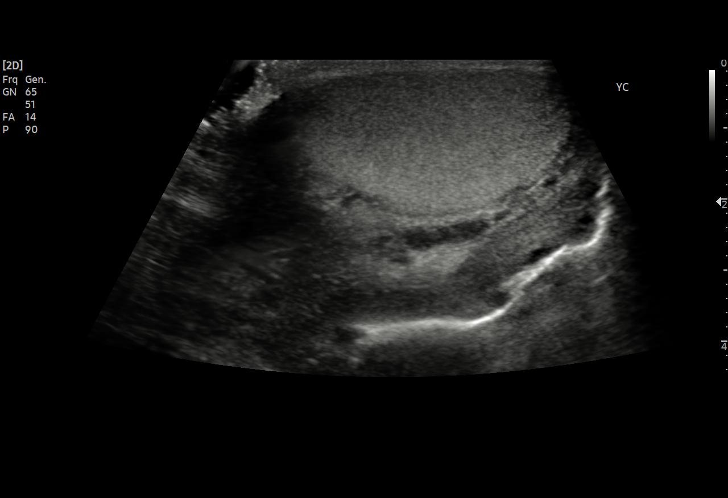
[im 61/67]
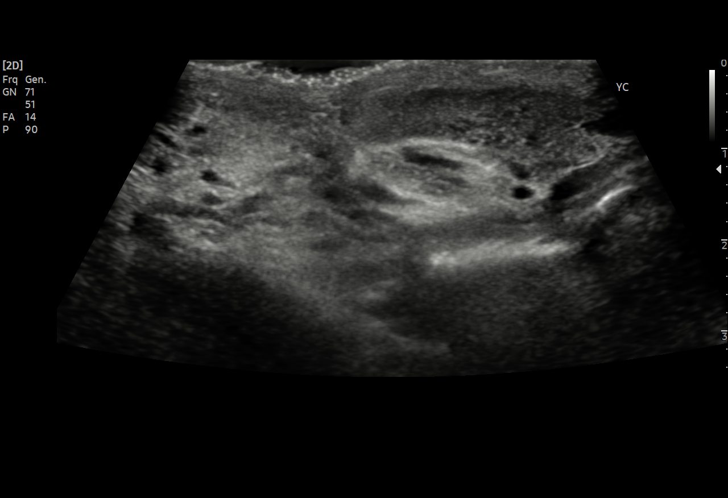
[im 67/67]
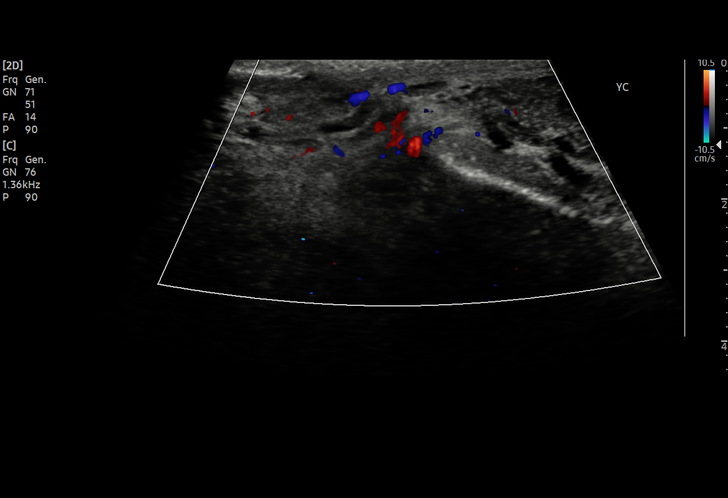

[15 of 25 positions shown; findings below may reference images not displayed]

FINDINGS: Right testicle

Measurements: 4.2 x 2.6 x 3 cm. No mass or microlithiasis
visualized. Thickened soft tissue posterior and inferior aspect of
the right testis with flow. Heterogenous echotexture.

Left testicle

Measurements: 4.6 x 2.3 x 2.7 cm. No mass or microlithiasis
visualized.

Right epididymis:  Normal in size and appearance.

Left epididymis:  Normal in size and appearance.

Hydrocele:  None visualized.

Varicocele:  None visualized.
IMPRESSION: 1. Heterogeneous hypoechoic vascular tissue surrounding inferior
pole of the testis and slightly posterior to it. This is asymmetric
compared to the contralateral left testis. Suspect that the findings
are related to soft tissue contusion and edema involving the scrotum
and possibly the tail of the epididymis. Short interval sonographic
follow-up recommended to ensure resolution.
2. Otherwise negative scrotal ultrasound
